# Patient Record
Sex: Female | Born: 1965 | Hispanic: No | Marital: Married | Smoking: Never smoker
Health system: Southern US, Community
[De-identification: ages and names within clinical notes are randomized; demographics above are authoritative.]

## PROBLEM LIST (undated history)

## (undated) DIAGNOSIS — M349 Systemic sclerosis, unspecified: Secondary | ICD-10-CM

## (undated) DIAGNOSIS — Q211 Atrial septal defect: Secondary | ICD-10-CM

## (undated) HISTORY — DX: Atrial septal defect: Q21.1

## (undated) HISTORY — DX: Systemic sclerosis, unspecified: M34.9

---

## 1997-08-23 ENCOUNTER — Emergency Department (HOSPITAL_COMMUNITY): Admission: EM | Admit: 1997-08-23 | Discharge: 1997-08-23 | Payer: Self-pay | Admitting: Emergency Medicine

## 2004-08-05 ENCOUNTER — Emergency Department (HOSPITAL_COMMUNITY): Admission: EM | Admit: 2004-08-05 | Discharge: 2004-08-05 | Payer: Self-pay | Admitting: Family Medicine

## 2008-11-14 ENCOUNTER — Encounter: Payer: Self-pay | Admitting: Pulmonary Disease

## 2008-11-15 ENCOUNTER — Encounter: Payer: Self-pay | Admitting: Pulmonary Disease

## 2008-12-26 ENCOUNTER — Encounter: Payer: Self-pay | Admitting: Pulmonary Disease

## 2009-01-10 ENCOUNTER — Ambulatory Visit (HOSPITAL_COMMUNITY): Admission: RE | Admit: 2009-01-10 | Discharge: 2009-01-10 | Payer: Self-pay | Admitting: Rheumatology

## 2009-02-05 ENCOUNTER — Encounter: Payer: Self-pay | Admitting: Pulmonary Disease

## 2009-02-21 ENCOUNTER — Ambulatory Visit: Payer: Self-pay | Admitting: Pulmonary Disease

## 2009-02-21 DIAGNOSIS — M349 Systemic sclerosis, unspecified: Secondary | ICD-10-CM | POA: Insufficient documentation

## 2009-02-26 ENCOUNTER — Encounter: Payer: Self-pay | Admitting: Pulmonary Disease

## 2009-03-05 ENCOUNTER — Telehealth (INDEPENDENT_AMBULATORY_CARE_PROVIDER_SITE_OTHER): Payer: Self-pay | Admitting: *Deleted

## 2009-03-05 DIAGNOSIS — J8410 Pulmonary fibrosis, unspecified: Secondary | ICD-10-CM | POA: Insufficient documentation

## 2009-03-08 ENCOUNTER — Ambulatory Visit: Payer: Self-pay | Admitting: Pulmonary Disease

## 2009-03-12 ENCOUNTER — Ambulatory Visit: Payer: Self-pay | Admitting: Internal Medicine

## 2009-03-28 ENCOUNTER — Ambulatory Visit: Payer: Self-pay | Admitting: Pulmonary Disease

## 2009-03-28 DIAGNOSIS — I2789 Other specified pulmonary heart diseases: Secondary | ICD-10-CM | POA: Insufficient documentation

## 2009-04-02 ENCOUNTER — Ambulatory Visit (HOSPITAL_COMMUNITY): Admission: RE | Admit: 2009-04-02 | Discharge: 2009-04-02 | Payer: Self-pay | Admitting: Pulmonary Disease

## 2009-04-06 ENCOUNTER — Encounter: Payer: Self-pay | Admitting: Pulmonary Disease

## 2009-04-17 ENCOUNTER — Encounter: Payer: Self-pay | Admitting: Pulmonary Disease

## 2009-04-23 ENCOUNTER — Encounter: Payer: Self-pay | Admitting: Pulmonary Disease

## 2009-05-15 ENCOUNTER — Encounter: Payer: Self-pay | Admitting: Pulmonary Disease

## 2009-06-06 ENCOUNTER — Encounter: Payer: Self-pay | Admitting: Pulmonary Disease

## 2009-10-04 ENCOUNTER — Encounter (INDEPENDENT_AMBULATORY_CARE_PROVIDER_SITE_OTHER): Payer: Self-pay | Admitting: *Deleted

## 2009-10-04 ENCOUNTER — Ambulatory Visit: Payer: Self-pay | Admitting: Pulmonary Disease

## 2009-10-08 ENCOUNTER — Telehealth: Payer: Self-pay | Admitting: Internal Medicine

## 2009-10-15 ENCOUNTER — Ambulatory Visit: Payer: Self-pay | Admitting: Cardiology

## 2009-10-15 ENCOUNTER — Ambulatory Visit: Payer: Self-pay

## 2009-10-15 ENCOUNTER — Ambulatory Visit (HOSPITAL_COMMUNITY): Admission: RE | Admit: 2009-10-15 | Discharge: 2009-10-15 | Payer: Self-pay | Admitting: Pulmonary Disease

## 2009-10-15 ENCOUNTER — Encounter: Payer: Self-pay | Admitting: Pulmonary Disease

## 2009-10-17 ENCOUNTER — Ambulatory Visit: Payer: Self-pay | Admitting: Internal Medicine

## 2009-10-17 ENCOUNTER — Encounter: Payer: Self-pay | Admitting: Internal Medicine

## 2009-10-17 ENCOUNTER — Encounter: Payer: Self-pay | Admitting: Pulmonary Disease

## 2009-10-17 DIAGNOSIS — K224 Dyskinesia of esophagus: Secondary | ICD-10-CM | POA: Insufficient documentation

## 2009-10-17 DIAGNOSIS — K219 Gastro-esophageal reflux disease without esophagitis: Secondary | ICD-10-CM | POA: Insufficient documentation

## 2009-10-22 ENCOUNTER — Encounter: Payer: Self-pay | Admitting: Pulmonary Disease

## 2009-10-24 ENCOUNTER — Telehealth (INDEPENDENT_AMBULATORY_CARE_PROVIDER_SITE_OTHER): Payer: Self-pay | Admitting: *Deleted

## 2009-10-30 ENCOUNTER — Ambulatory Visit: Payer: Self-pay | Admitting: Internal Medicine

## 2009-10-30 DIAGNOSIS — R9389 Abnormal findings on diagnostic imaging of other specified body structures: Secondary | ICD-10-CM | POA: Insufficient documentation

## 2010-01-02 ENCOUNTER — Encounter: Payer: Self-pay | Admitting: Internal Medicine

## 2010-02-04 ENCOUNTER — Telehealth: Payer: Self-pay | Admitting: Internal Medicine

## 2010-02-20 ENCOUNTER — Encounter: Payer: Self-pay | Admitting: Internal Medicine

## 2010-02-25 ENCOUNTER — Telehealth: Payer: Self-pay | Admitting: Internal Medicine

## 2010-02-25 ENCOUNTER — Telehealth: Payer: Self-pay | Admitting: Pulmonary Disease

## 2010-04-16 NOTE — Miscellaneous (Signed)
  Clinical Lists Changes  spoke with Dr. Allyson Sabal after his visit with pt.  Stressed the need to have right heart cath and shunt run given PFO.  He will schedule the pt for the procedure, and let me know when results are available.

## 2010-04-16 NOTE — Letter (Signed)
Summary: [facility] Office Visit Note   [facility] Office Visit Note   Imported By: Roderic Ovens 11/06/2009 15:04:47  _____________________________________________________________________  External Attachment:    Type:   Image     Comment:   External Document

## 2010-04-16 NOTE — Progress Notes (Signed)
Summary: waiting on records  ---- Converted from flag ---- ---- 10/20/2009 9:47 AM, Barbaraann Share MD wrote: Aundra Millet, please see if we can get a copy of recent consult from dr. Azzie Roup.  thanks ------------------------------   LM with Misty Stanley in Medical Records from [facility] to fax records.  Aundra Millet Reynolds LPN  October 24, 2009 2:34 PM   received records and put in [facility] very important look at folder.  Arman Filter LPN  October 25, 2009 9:47 AM

## 2010-04-16 NOTE — Assessment & Plan Note (Signed)
Summary: dysphagia--ch.   History of Present Illness Visit Type: consult  Primary GI MD: Stan Head MD [facility] Primary Provider: Dorothyann Peng, MD  Requesting Provider: Marcelyn Bruins, MD  Chief Complaint: dysphagia  History of Present Illness:   45 yo Asian ([place])woman with 6 months of dysphagia. She says dry food would stick and then even noodles at times. She felt hot in her chest at times also. Dr. Shelle Iron provided Dexilant smaples and she is without any of these problems since starting that. She has scleroderma. Recentky had an evaluation by Dr. Azzie Roup (this AM).   GI Review of Systems    Reports dysphagia with solids.      Denies abdominal pain, acid reflux, belching, bloating, chest pain, dysphagia with liquids, heartburn, loss of appetite, nausea, vomiting, vomiting blood, weight loss, and  weight gain.        Denies anal fissure, black tarry stools, change in bowel habit, constipation, diarrhea, diverticulosis, fecal incontinence, heme positive stool, hemorrhoids, irritable bowel syndrome, jaundice, light color stool, liver problems, rectal bleeding, and  rectal pain. Preventive Screening-Counseling & Management  Alcohol-Tobacco     Alcohol drinks/day: 0     Smoking Status: never     Passive Smoke Exposure: yes  Caffeine-Diet-Exercise     Caffeine use/day: 1     Caffeine Counseling: not indicated; caffeine use is not excessive or problematic     Does Patient Exercise: no  Comments: much less pasive smoke now not sedantary    Barium Swallow  Procedure date:  04/02/2009  Findings:       1.  Moderately dilated esophagus with a patulous GE junction and dysmotility. See report. 2.  No esophageal lesions demonstrated. 3.  No hiatal hernia or reflux.   Current Medications (verified): 1)  Aspirin Low Dose 81 Mg Tabs (Aspirin) .... Take 1 Tablet By Mouth Once A Day 2)  Plaquenil 200 Mg Tabs (Hydroxychloroquine Sulfate) .... Take 1 Tablet By Mouth Two  Times A Day 3)  Lisinopril 2.5 Mg Tabs (Lisinopril) .... Take 1/2 Tab By Mouth Daily 4)  Dexilant 60 Mg Cpdr (Dexlansoprazole) .... One Tablet By Mouth Once Daily  Allergies (verified): No Known Drug Allergies   Past History:  Past Medical History: Reviewed history from 02/21/2009 and no changes required. scleroderma/MCTD  Past Surgical History: Reviewed history from 02/21/2009 and no changes required. None  Family History: None per pt report.  No FH of Colon Cancer:  Social History: Reviewed history from 02/21/2009 and no changes required. Patient never smoked.  pt is married. pt has 3 children. 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   Vital Signs:  Patient profile:   45 year old female Height:      62 inches Weight:      136 pounds BMI:     24.96 BSA:     1.62 Pulse rate:   74 / minute Pulse rhythm:   regular BP sitting:   98 / 60  (left arm) Cuff size:   regular  Vitals Entered By: Ok Anis CMA (October 17, 2009 2:09 PM)  Physical Exam  General:  wd female in nad Eyes:  PERRLA and EOMI.   anicteric Mouth:  clear  Lungs:  minimal faint basilar crackles. Heart:  rrr, no mrg Abdomen:  Soft, nontender  and nondistended. No masses, hepatosplenomegaly or hernias noted. Normal bowel sounds. Extremities:  mild sausage digits  Cervical Nodes:  No significant cervical or supraclavicular adenopathy.  Psych:  Alert and cooperative. Normal mood and affect.   Impression & Recommendations:  Problem # 1:  GERD (ICD-530.81) Assessment New Response to Dexilant indicates this. 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 Continue PPI and change to Nexium 40 mg daily which is on formulary. If this does not work or she has recurrent problems is to call back. Otherwise she should see me  annually.  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 No bird's beak change to suggest achalasia. PPI has relieved dysphagia so will continue. No need for dilation or EGD in this clinical scenarion and with available information (Ba swallow and resolution of sxs on PPI).  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 NEXIUM 2)  Begin taking Nexium after you finish Dexilant samples. 3)  Please schedule a follow-up appointment in 1 year.  Call back sooner if you are having any heartburn or trouble swallowing. 4)  GI Reflux brochure given.  5)  Copy sent to : Dorothyann Peng, MD, Marcelyn Bruins, MD 6)  The medication list was reviewed and reconciled.  All changed / newly prescribed medications were explained.  A complete medication list was provided to the patient / caregiver. Prescriptions: NEXIUM 40 MG CPDR (ESOMEPRAZOLE MAGNESIUM) 1 by mouth once daily 30 minutes before a meal  #30 x 11   Entered and Authorized by:   Iva Boop MD, [facility]   Signed by:   Iva Boop MD, [facility] on 10/17/2009   Method used:   Electronically to        [facility] [phone]* (retail)       [address]       [place], [place]  [address]       Ph: [phone] or [phone]       Fax: [phone]   RxID:   [id number]  also cc: Azzie Roup, MD

## 2010-04-16 NOTE — Letter (Signed)
Summary: [facility]  [facility]   Imported By: Lester  04/03/2009 10:21:29  _____________________________________________________________________  External Attachment:    Type:   Image     Comment:   External Document

## 2010-04-16 NOTE — Letter (Signed)
Summary: [facility]  [facility]   Imported By: Lester [place] 06/12/2009 09:56:52  _____________________________________________________________________  External Attachment:    Type:   Image     Comment:   External Document

## 2010-04-16 NOTE — Progress Notes (Signed)
Summary: Needs Nexium refill  Phone Note Call from Patient Call back at Work Phone [phone]   Call For: Dr Leone Payor Reason for Call: Refill Medication Summary of Call: Needs a refill on her Nexium sent to [facility] needs generic because she cant afford $200. Has been out for 2 wks already Leanor Kail [facility]  February 05, 2010 8:27 AM Initial call taken by:    Follow-up for Phone Call        rx sent to [facility], samples left at front desk.  Pt aware. Follow-up by: Francee Piccolo CMA Duncan Dull),  February 05, 2010 10:40 AM    New/Updated Medications: NEXIUM 40 MG CPDR (ESOMEPRAZOLE MAGNESIUM) 1 by mouth once daily 30 minutes before a meal Prescriptions: NEXIUM 40 MG CPDR (ESOMEPRAZOLE MAGNESIUM) 1 by mouth once daily 30 minutes before a meal  #90 x 3   Entered by:   Francee Piccolo CMA (AAMA)   Authorized by:   Iva Boop MD, [facility]   Signed by:   Francee Piccolo CMA (AAMA) on 02/05/2010   Method used:   Electronically to        [facility]* (retail)             ,          Ph: [phone]       Fax: [phone]   RxID:   [id number]

## 2010-04-16 NOTE — Letter (Signed)
Summary: [facility] Extended Visit   [facility] Extended Visit   Imported By: Roderic Ovens 01/31/2010 15:57:45  _____________________________________________________________________  External Attachment:    Type:   Image     Comment:   External Document

## 2010-04-16 NOTE — Assessment & Plan Note (Signed)
Summary: rov for ISLD, ?pulmonary htn   Primary Provider/Referring Provider:  Dorothyann Peng  CC:  Pt is here for a routine f/u appt.  Pt denied sob or cough.  Pt does c/o " dry food getting stuck in throat."  Pt denied edema.  .  History of Present Illness: The pt comes in today for f/u of her multiple pulmonary issues.  She has autoimmune disease that has been characterized as scleroderma based on serologies and cutaneous biopsies, and also has ?pulmonary htn along with fibrotic changes on ct chest and dilated esophagus.  She had a f/u echo in FEB which showed a PFO, but by report did not show RV changes (there was not an obvious measurement documented of PA pressures).  The pt comes in today for f/u where I now find out she has not had further rheumatology evaluation.  She denies signficant sob or cough, but when questioned carefully, she clearly has some degree of doe.  She is having a lot of reflux symptoms, and c/o of "food getting stuck" on swallowing.  Medications Prior to Update: 1)  Aspirin Low Dose 81 Mg Tabs (Aspirin) .... Take 1 Tablet By Mouth Once A Day 2)  Plaquenil 200 Mg Tabs (Hydroxychloroquine Sulfate) .... Take 1 Tablet By Mouth Two Times A Day  Current Medications (verified): 1)  Aspirin Low Dose 81 Mg Tabs (Aspirin) .... Take 1 Tablet By Mouth Once A Day 2)  Plaquenil 200 Mg Tabs (Hydroxychloroquine Sulfate) .... Take 1 Tablet By Mouth Two Times A Day 3)  Lisinopril 2.5 Mg Tabs (Lisinopril) .... Take 1/2 Tab By Mouth Daily  Allergies (verified): No Known Drug Allergies  Past History:  Past medical, surgical, family and social histories (including risk factors) reviewed, and no changes noted (except as noted below).  Past Medical History: Reviewed history from 02/21/2009 and no changes required. scleroderma/MCTD  Past Surgical History: Reviewed history from 02/21/2009 and no changes required. None  Family History: Reviewed history from 02/21/2009 and no changes  required. None per pt report.   Social History: Reviewed history from 02/21/2009 and no changes required. Patient never smoked.  pt is married. pt has 3 children. 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    Vital Signs:  Patient profile:   45 year old female Height:      62 inches Weight:      135.25 pounds BMI:     24.83 O2 Sat:      98 % on Room air Temp:     98.2 degrees F oral Pulse rate:   71 / minute BP sitting:   94 / 60  (left arm) Cuff size:   regular  Vitals Entered By: Arman Filter LPN (October 04, 2009 9:05 AM)  O2 Flow:  Room air CC: Pt is here for a routine f/u appt.  Pt denied sob or cough.  Pt does c/o " dry food getting stuck in throat."  Pt denied edema.   Comments Medications reviewed with patient Arman Filter LPN  October 04, 2009 9:05 AM    Physical Exam  General:  wd female in nad Nose:  no purulence or drainage noted. Lungs:  minimal faint basilar crackles. Heart:  rrr, no mrg Extremities:  no edema or cyanosis Neurologic:  alert and oriented, moves all 4.   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 ongoing aspiration from her esophageal dysmotolity with symptomatic dysphagia and reflux.  I think she needs to have a GI evaluation, but also more aggressive  treatment of her autoimmune disease.  I am very worried about this patient, and the fact she really doesn't appreciate the gravity of her situation due to the modest language barrier.  Will arrange for GI consult, and also get her referred to another rheumatologist (Dr. Corliss Skains has discharged her from her practice for missing a few visits, but she has been a model pt for me with excellent compliance).    Problem # 2:  PULMONARY HYPERTENSION (ICD-416.8) The pt had an echo in the past with pulmonary htn documented, and had a repeat echo in FEB of this year verified a PFO, but did not show pulmonary htn by the report.  It has been six months, and she obviously needs a f/u study.  She is not asymptomatic, and does have an abnormal dlco on her pfts (although may be due to her pulmonary parenchymal findings).  I have a very low threshold for doing right heart cath.  Will re-evaluate after I see the results of her f/u echo.    Medications Added to Medication List This Visit: 1)  Lisinopril 2.5 Mg Tabs (Lisinopril) .... Take 1/2 tab by mouth daily  Other Orders: Est. Patient Level IV ([address]) Rheumatology Referral (Rheumatology) Gastroenterology Referral (GI) Echo Referral (Echo)  Patient Instructions: 1)  will schedule for echo to evaluate your heart again 2)  will schedule followup with another rheumatologist and with a "stomach" doctor for your swallowing issue. 3)  trial of dexilant 60mg  one each day. 4)  will call you with results of heart study.

## 2010-04-16 NOTE — Consult Note (Signed)
Summary: [facility]   Imported By: Lennie Odor 11/13/2009 16:41:45  _____________________________________________________________________  External Attachment:    Type:   Image     Comment:   External Document

## 2010-04-16 NOTE — Letter (Signed)
Summary: [facility]  [facility]   Imported By: Lester [place] 06/12/2009 09:54:39  _____________________________________________________________________  External Attachment:    Type:   Image     Comment:   External Document

## 2010-04-16 NOTE — Assessment & Plan Note (Signed)
Summary: ov to discuss PFT and CT results/mg   Copy to:  Shaili Deveshwar/ Nanetta Batty Primary Provider/Referring Provider:  None  CC:  Pt is here for a f/u appt to discuss PFT and CT results. .  History of Present Illness: The pt comes in today for f/u of her recent xrays and pfts.  She was found to have by ct chest infiltrates in both bases that were primarily ground glass in nature with interstitial changes as well.  She was also found to have esophageal abnormalities.  Her pfts show mild restriction with mild decrease in dlco.  Her echo report was finally located, and shows mild to moderate pulmonary htn with a patent foramen ovale noted.  I have gone over this as best I can with patient, but there is limited understanding with the language barrier.  Medications Prior to Update: 1)  Aspirin Low Dose 81 Mg Tabs (Aspirin) .... Take 1 Tablet By Mouth Once A Day 2)  Plaquenil 200 Mg Tabs (Hydroxychloroquine Sulfate) .... Take 1 Tablet By Mouth Two Times A Day  Allergies (verified): No Known Drug Allergies  Vital Signs:  Patient profile:   45 year old female Height:      62 inches Weight:      140 pounds O2 Sat:      100 % on Room air Temp:     97.3 degrees F oral Pulse rate:   87 / minute BP sitting:   100 / 60  (left arm) Cuff size:   regular  Vitals Entered By: Arman Filter LPN (March 28, 2009 3:09 PM)  O2 Flow:  Room air CC: Pt is here for a f/u appt to discuss PFT and CT results.  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  This is either inflammatory infiltrates from her known scleroderma/MCTD vs chronic aspiration related to esophageal dysmotility related to her scleroderma.  Her esophagus appears to be abnl on ct, and I think  she needs a barium swallow for evaluation.  She does describe reflux with regurgitation at times.  If this is due to pulmonary inflammation directly related to her underlying autoimmune disease, she will need more aggressive immunosuppressive therapy per rheumatology.  Her pft show definite restriction with dlco abnl, which can be caused by either entity.  She will need a walk at some point in the future.  I have spent with pt today discussing all of the above.  I am not sure she completely understands the seriousness of her condition due to the language barrier.  Problem # 2:  PULMONARY HYPERTENSION (ICD-416.8) the pt had mild to moderate pulmonary htn estimated by echo in july of last year, and the radiologist commented on recent ct chest the PA's appeared overly enlarged.  The echo report also questioned a PFO.  At this point, she will need right heart cath to measure PA pressures and also to do shunt run.  Will arrange for f/u appt with cardiology, and they can also address the question of PFO?  Depending upon results, will need to consider a trial of vasodilator therapy, although clinically she is a class I patient.  Other Orders: Est. Patient Level IV (47829) Cardiology Referral (Cardiology) Radiology Referral (Radiology)  Patient  Instructions: 1)  will schedule for barium swallow to look at your esophagus, and see if your are swallowing food down into your lungs. 2)  will get an apptm for you to see the cardiologist again. 3)  will call you once we have the results of the testing.  Appended Document: ov to discuss PFT and CT results/mg megan, please call Dr Hazle Coca office first thing in am and make sure they got my faxed note.  Pt is seeing him thurs.  Appended Document: ov to discuss PFT and CT results/mg called and spoke with Neysa Bonito from Dr. Hazle Coca office and verified they did receive fax from [facility].

## 2010-04-16 NOTE — Assessment & Plan Note (Signed)
Summary: NP6/CARDIAC WORK UP / PT HAS [employer]. GD   Visit Type:  Follow-up Referring Provider:  Azzie Roup, MD Primary Provider:  Dorothyann Peng, MD   CC:  6 minute walk test.  History of Present Illness: 45 y/o [place] woman with h/o of connective tissue d/o (felt to be lupus) complicated by esophageal dysmotility and intersitial lung disease with decreased DLCO on PTDs. Referred by Dr. Dareen Piano for consideration of right heart catheterization.  Had echo in 2010 with [facility] which showed a PFO and mildly elevated R sided pressures. Had f/u echo 10/15/09 which showed LVEF 55-60% with normal RV. There was mild TR and PAP estimated at 30-96mm HG.   Denies any dyspnea. 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 [place]. Denies any dizziness or presyncope. No palpitations and CP. + food gets stuck and reflux. Saw Dr. Leone Payor and started on PPI and symptoms resolved. +Raynaud's  Medications Prior to Update: 1)  Aspirin Low Dose 81 Mg Tabs (Aspirin) .... Take 1 Tablet By Mouth Once A Day 2)  Plaquenil 200 Mg Tabs (Hydroxychloroquine Sulfate) .... Take 1 Tablet By Mouth Two Times A Day 3)  Lisinopril 2.5 Mg Tabs (Lisinopril) .... Take 1/2 Tab By Mouth Daily 4)  Nexium 40 Mg Cpdr (Esomeprazole Magnesium) .Marland Kitchen.. 1 By Mouth Once Daily 30 Minutes Before A Meal  Current Medications (verified): 1)  Aspirin Low Dose 81 Mg Tabs (Aspirin) .... Take 1 Tablet By Mouth Once A Day 2)  Plaquenil 200 Mg Tabs (Hydroxychloroquine Sulfate) .... Take 1 Tablet By Mouth Two Times A Day 3)  Lisinopril 2.5 Mg Tabs (Lisinopril) .... Take 1/2 Tab By Mouth Daily 4)  Nexium 40 Mg Cpdr (Esomeprazole Magnesium) .Marland Kitchen.. 1 By Mouth Once Daily 30 Minutes Before A Meal  Allergies (verified): No Known Drug Allergies  Past History:  Past Medical History: Last updated: 02/21/2009 scleroderma/MCTD  Past Surgical History: Last updated: 02/21/2009 None  Family History: Last  updated: 10/17/2009 None per pt report.  No FH of Colon Cancer:  Social History: Last updated: 02/21/2009 Patient never smoked.  pt is married. pt has 3 children. 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 None per pt report.  No FH of Colon Cancer:  Social History: Reviewed history from 02/21/2009 and no changes required. Patient never smoked.  pt is married. pt has 3 children. 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   Vital Signs:  Patient profile:   45 year old female Height:      62 inches Weight:      136 pounds Pulse rate:   69 / minute BP sitting:   90 / 56  (left arm)  Vitals Entered By: Laurance Flatten CMA (October 30, 2009 8:48 AM) CC: 6 minute walk test Pain Assessment Patient in pain? no       Does patient need assistance? Ambulation Normal Comments Pt was walked in back hall with O2 sat monitor in place.  O2 sats ranged from 99% to 97% at the lowest with a HR of 73 to begin and 111 at end of test. She walked 1000 feet without any complaints of SOB/fatigue or  palpitations   Physical Exam  General:  Thin well appearing. no resp difficulty HEENT: normal Neck: supple. no JVD. Carotids 2+ bilat; no bruits. No lymphadenopathy or thryomegaly appreciated. Cor: PMI nondisplaced. No RV lift. Regular rate & rhythm. No rubs, gallops, murmur. Lungs: clear Abdomen: soft, nontender, nondistended. No hepatosplenomegaly. No bruits or masses. Good bowel sounds. Extremities: no cyanosis, rash, edema. impressive clubbing Neuro: alert & orientedx3, cranial nerves grossly intact. moves all 4 extremities w/o difficulty.  affect pleasant    Impression & Recommendations:  Problem # 1:  ABNORMAL ECHOCARDIOGRAM (ICD-793.2) Based on her echo she does not have significant pulmonary HTN despite the fact that she is at high risk. Also no evidence of PAH/right heart strain on exam or ECG. Appears to be NYHA Class I without functional limitation. 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 We will do 6 MW with pulse oximetry. If truly NYHA Class I with no exertional desaturations we will defer right heart cath for now and f/u with echo in 6 months to reassess. If compromised or exertional desats will need RHC now.   walked over 1000 feet with no limitations (could have gone farther) sats 98% on RA. Will defer RHC for now. See back in 6 months unless developing symptoms in which case we will see sooner.   Orders: EKG w/ Interpretation (93000)  Patient Instructions: 1)  Your physician recommends that you schedule a follow-up appointment in: 6 months with a 6 minute walk test and a 2 d Echo 2)  Your physician recommends that you continue on your current medications as directed. Please refer to the Current Medication list given to you today.

## 2010-04-16 NOTE — Progress Notes (Signed)
Summary: Appt sooner than first available  Phone Note From Other Clinic   Caller: Almyra Free [email or web address] @n  Dr Shelle Iron Call For: Dr Leone Payor Reason for Call: Schedule Patient Appt Summary of Call: Requesting patiebnt be seen sooner than 12-03-09, within 2wks for dysphagia. Initial call taken by: Leanor Kail [facility],  October 08, 2009 11:43 AM  Follow-up for Phone Call        Left message for patient to call back Darcey Nora RN, [facility]  October 08, 2009 12:17   patient rescheduled for 10/17/09 2:15 with Almyra Free. Follow-up by: Darcey Nora RN, CGRN,  October 08, 2009 2:13 PM

## 2010-04-16 NOTE — Letter (Signed)
Summary: New Patient letter  [facility]  [address] [place], [place] [address]   Phone: [phone]  Fax: [phone]       10/04/2009 MRN: [id number]  Stacey Williamson [address] [place], [place]  [address]  Dear Ms. Stacey Williamson,  Welcome to the Gastroenterology Division at [facility].    You are scheduled to see Dr.  Leone Payor on 12-03-09 at 8:45a.m. on the 3rd floor at [facility], [address].  We ask that you try to arrive at our office 15 minutes prior to your appointment time to allow for check-in.  We would like you to complete the enclosed self-administered evaluation form prior to your visit and bring it with you on the day of your appointment.  We will review it with you.  Also, please bring a complete list of all your medications or, if you prefer, bring the medication bottles and we will list them.  Please bring your insurance card so that we may make a copy of it.  If your insurance requires a referral to see a specialist, please bring your referral form from your primary care physician.  Co-payments are due at the time of your visit and may be paid by cash, check or credit card.     Your office visit will consist of a consult with your physician (includes a physical exam), any laboratory testing he/she may order, scheduling of any necessary diagnostic testing (e.g. x-ray, ultrasound, CT-scan), and scheduling of a procedure (e.g. Endoscopy, Colonoscopy) if required.  Please allow enough time on your schedule to allow for any/all of these possibilities.    If you cannot keep your appointment, please call [phone] to cancel or reschedule prior to your appointment date.  This allows [place] the opportunity to schedule an appointment for another patient in need of care.  If you do not cancel or reschedule by 5 p.m. the business day prior to your appointment date, you will be charged a $50.00 late cancellation/no-show fee.    Thank you for choosing  [facility] for your medical needs.  We appreciate the opportunity to care for you.  Please visit [place] at our website  to learn more about our practice.                     Sincerely,                                                             The Gastroenterology Division

## 2010-04-16 NOTE — Miscellaneous (Signed)
Summary: Orders Update  Clinical Lists Changes  Orders: Added new Referral order of Echo Referral (Echo) - Signed 

## 2010-04-16 NOTE — Miscellaneous (Signed)
Summary: f/u echo with no pulmonary htn  Clinical Lists Changes  the pt has a h/o pulm htn by prior echo, and I had asked Dr.Berry from cardiology to do right heart cath to document pressures given her history of scleroderma.  Instead, he repeated the echo on 04/23/09.  This showed a definite PFO, nl RV pressures..she was felt to not need a right heart cath I do believe she needs very close echo f/u given her history. will arrange f/u for pt with me for her isld.  Appended Document: f/u echo with no pulmonary htn megan, let pt know that I need to see her in July for f/u of her lung issues.  Appended Document: f/u echo with no pulmonary htn megan see above.  She also needs an apptm with Gi because of her reflux/regurgitation that is related to her scleroderma.Marland KitchenMarland KitchenMarland KitchenI can send an order to [facility] once you discuss it with her.  I have tried to get in touch with her multiple times.  left message to call us.  if we can't get ahold of her, just get ov with me.  Appended Document: f/u echo with no pulmonary htn called and spoke with pt.  pt scheduled to see [facility] 09-26-2009 at 9am.  also informed pt of referral to GI.  Pt refused stating her reflux is "all gone."  I informed her the importance of the GI referral but pt still refused.    Appended Document: f/u echo with no pulmonary htn noted.

## 2010-04-18 NOTE — Progress Notes (Signed)
Summary: Medication  Phone Note Call from Patient Call back at Home Phone [phone]   Caller: Patient Call For: Dr. Leone Payor Reason for Call: Refill Medication Summary of Call: Needs refill on her Nexium.[facility] Initial call taken by: Karna Christmas,  February 25, 2010 12:22 PM  Follow-up for Phone Call        Rx re-sent to [facility].  Message left on home number that rx has been resent.  To call if she has any further problems. Follow-up by: Francee Piccolo CMA Duncan Dull),  February 25, 2010 2:08 PM    New/Updated Medications: NEXIUM 40 MG CPDR (ESOMEPRAZOLE MAGNESIUM) 1 by mouth once daily 30 minutes before a meal Prescriptions: NEXIUM 40 MG CPDR (ESOMEPRAZOLE MAGNESIUM) 1 by mouth once daily 30 minutes before a meal  #90 x 3   Entered by:   Francee Piccolo CMA (AAMA)   Authorized by:   Iva Boop MD, [facility]   Signed by:   Francee Piccolo CMA (AAMA) on 02/25/2010   Method used:   Electronically to        [facility]* (retail)             ,          Ph: [phone]       Fax: [phone]   RxID:   [id number]

## 2010-04-18 NOTE — Letter (Signed)
Summary: [facility] Extended Visit Note   [facility] Extended Visit Note   Imported By: Roderic Ovens 03/14/2010 10:01:09  _____________________________________________________________________  External Attachment:    Type:   Image     Comment:   External Document

## 2010-04-18 NOTE — Progress Notes (Signed)
Summary: nexium rx  Phone Note Call from Patient   Caller: Patient Call For: clance Summary of Call: pt wants rx for nexium called in to [facility]. says she had requested this previously but this had not been done. (i asked her if kc was the prescribing dr and she says yes). pt # [id number] Initial call taken by: Tivis Ringer, CNA,  February 25, 2010 10:17 AM  Follow-up for Phone Call        spoke with pt and advised rx for nexium was sent by Dr. Teresita Madura office. She states there is some problem withthe rx. I advised her to speak to dr Teresita Madura office about this because that is who prescribed the medication. I transferred pt to [facility]. Carron Curie CMA  February 25, 2010 12:21 PM

## 2010-04-22 ENCOUNTER — Ambulatory Visit (HOSPITAL_COMMUNITY): Payer: BC Managed Care – PPO | Attending: Cardiology

## 2010-04-22 DIAGNOSIS — I079 Rheumatic tricuspid valve disease, unspecified: Secondary | ICD-10-CM | POA: Insufficient documentation

## 2010-04-22 DIAGNOSIS — I2789 Other specified pulmonary heart diseases: Secondary | ICD-10-CM

## 2010-04-29 ENCOUNTER — Encounter: Payer: Self-pay | Admitting: Pulmonary Disease

## 2010-04-29 ENCOUNTER — Ambulatory Visit (INDEPENDENT_AMBULATORY_CARE_PROVIDER_SITE_OTHER): Payer: BC Managed Care – PPO | Admitting: Pulmonary Disease

## 2010-04-29 DIAGNOSIS — J8410 Pulmonary fibrosis, unspecified: Secondary | ICD-10-CM

## 2010-05-14 NOTE — Assessment & Plan Note (Signed)
Summary: rov for ISLD   Copy to:  Azzie Roup, MD Primary Provider/Referring Provider:  Dorothyann Peng, MD   CC:  F/u appt.  Pt had echo done 04-22-2010.  Pt states occ. non-productive cough.  denies increased sob, sore throat, and or fever. 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  She has had a recent echo which shows no significant pulmonary htn, and she feels her swallowing has been ok as long as she stays on PPI.  Her breathing is at baseline, and she denies any worsening of her exertional tolerance.  She has mild nonproductive cough that she feels is not overly significant.  Medications Prior to Update: 1)  Aspirin Low Dose 81 Mg Tabs (Aspirin) .... Take 1 Tablet By Mouth Once A Day 2)  Plaquenil 200 Mg Tabs (Hydroxychloroquine Sulfate) .... Take 1 Tablet By Mouth Two Times A Day 3)  Lisinopril 2.5 Mg Tabs (Lisinopril) .... Take 1/2 Tab By Mouth Daily 4)  Nexium 40 Mg Cpdr (Esomeprazole Magnesium) .Marland Kitchen.. 1 By Mouth Once Daily 30 Minutes Before A Meal  Current Medications (verified): 1)  Aspirin Low Dose 81 Mg Tabs (Aspirin) .... Take 1 Tablet By Mouth Once A Day 2)  Plaquenil 200 Mg Tabs (Hydroxychloroquine Sulfate) .... Take 1 Tablet By Mouth Two Times A Day 3)  Nexium 40 Mg Cpdr (Esomeprazole Magnesium) .Marland Kitchen.. 1 By Mouth Once Daily 30 Minutes Before A Meal  Allergies (verified): No Known Drug Allergies  Past History:  Past medical, surgical, family and social histories (including risk factors) reviewed, and no changes noted (except as noted below).  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 None  Family History: Reviewed history from  10/17/2009 and no changes required. None per pt report.  No FH of Colon Cancer:  Social History: Reviewed history from 02/21/2009 and no changes required. Patient never smoked.  pt is married. pt has 3 children. 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  The patient denies shortness of breath with activity, shortness of breath at rest, productive cough, coughing up blood, chest pain, irregular heartbeats, acid heartburn, indigestion, loss of appetite, weight change, abdominal pain, difficulty swallowing, sore throat, tooth/dental problems, headaches, nasal congestion/difficulty breathing through nose, sneezing, itching, ear ache, anxiety, depression, hand/feet swelling, joint stiffness or pain, rash, change in color of mucus, and fever.    Vital Signs:  Patient profile:   45 year old female Height:      62 inches Weight:      142.25 pounds BMI:     26.11 O2 Sat:      99 % on Room air Temp:     98.1 degrees F oral Pulse rate:   76 / minute BP sitting:   102 / 56  (left arm) Cuff size:   regular  Vitals Entered By: Arman Filter LPN (April 29, 2010 9:12 AM)  O2 Flow:  Room air CC: F/u appt.  Pt had echo done 04-22-2010.  Pt states occ. non-productive cough.  denies increased sob, sore throat, or fever.  Comments Medications reviewed with patient Arman Filter LPN  April 29, 2010 9:12 AM    Physical  Exam  General:  wd female in nad Lungs:  mild basilar crackles, no wheezing Heart:  rrr Extremities:  no edema or cyanosis  Neurologic:  alert, oriented, moves all 4.   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  Currently, she is maintaining a stable baseline wrt her exertional tolerance, and denies any ongoing cough.  Will see her back in 6mos, and will check cxr and full pfts to compare  to last year.  She is to call me if pulmonary symptoms develop in the interim.  Problem # 2:  PULMONARY HYPERTENSION (ICD-416.8) the pt's most recent echo shows no significant pulm. htn...this is good news, but probably needs to be followed yearly .  Other Orders: Est. Patient Level III (11914)  Patient Instructions: 1)  your heart study was ok. 2)  will see you back in 6mos, and will check lung xray and also do breathing studies.   Appended Document: rov for ISLD megan, please check and make sure this pt got an appt for pfts with her next visit.  Appended Document: rov for ISLD yes.  pt is scheduled for pft at 9 am on 11-11-2010 and is scheduled to see you same day at 10 am.

## 2010-11-08 ENCOUNTER — Encounter: Payer: Self-pay | Admitting: Pulmonary Disease

## 2010-11-11 ENCOUNTER — Ambulatory Visit (INDEPENDENT_AMBULATORY_CARE_PROVIDER_SITE_OTHER): Payer: BC Managed Care – PPO | Admitting: Pulmonary Disease

## 2010-11-11 ENCOUNTER — Other Ambulatory Visit: Payer: Self-pay | Admitting: Pulmonary Disease

## 2010-11-11 ENCOUNTER — Encounter: Payer: Self-pay | Admitting: Pulmonary Disease

## 2010-11-11 ENCOUNTER — Ambulatory Visit (INDEPENDENT_AMBULATORY_CARE_PROVIDER_SITE_OTHER)
Admission: RE | Admit: 2010-11-11 | Discharge: 2010-11-11 | Disposition: A | Discharge status: Home / Self Care | Payer: BC Managed Care – PPO | Source: Ambulatory Visit | Attending: Internal Medicine | Admitting: Internal Medicine

## 2010-11-11 VITALS — BP 90/60 | HR 74 | Temp 98.1°F | Ht 62.0 in | Wt 121.0 lb

## 2010-11-11 DIAGNOSIS — I272 Pulmonary hypertension, unspecified: Secondary | ICD-10-CM

## 2010-11-11 DIAGNOSIS — J8410 Pulmonary fibrosis, unspecified: Secondary | ICD-10-CM

## 2010-11-11 DIAGNOSIS — I2789 Other specified pulmonary heart diseases: Secondary | ICD-10-CM

## 2010-11-11 LAB — PULMONARY FUNCTION TEST

## 2010-11-11 NOTE — Progress Notes (Signed)
PFT done today. 

## 2010-11-11 NOTE — Assessment & Plan Note (Signed)
The patient has known interstitial disease that is either due to reflux with microaspiration versus her autoimmune disease.  She is doing much better clinically since eating better and losing weight, and her x-ray today is stable.  Her  PFTs are improved from the last check.  I have asked her to followup with me in one year, and will recheck her x-ray and breathing studies.

## 2010-11-11 NOTE — Progress Notes (Signed)
  Subjective:    Patient ID: Stacey Williamson, female    DOB: May 22, 1965, 45 y.o.   MRN: [id number]  HPI The patient comes in today for followup of her known interstitial disease.  It has been unclear whether this is due to her autoimmune disease, or whether this is related to occult aspiration associated with her esophageal issues.  Since the last visit, the patient has been eating much better, and has lost weight through a vigorous exercise program.  Since doing this, her reflux has essentially resolved.  She did have pulmonary function studies today which actually showed an improvement from her study in the end of 2010.  Her chest x-ray today is also totally stable.  The patient denies shortness of breath, and has had no significant cough.  She has a followup scheduled with cardiology to keep an eye on possible pulmonary hypertension.   Review of Systems  Constitutional: Positive for unexpected weight change. Negative for fever.  HENT: Positive for trouble swallowing. Negative for ear pain, nosebleeds, congestion, sore throat, rhinorrhea, sneezing, dental problem, postnasal drip and sinus pressure.   Eyes: Negative for redness and itching.  Respiratory: Negative for cough, chest tightness, shortness of breath and wheezing.   Cardiovascular: Negative for palpitations and leg swelling.  Gastrointestinal: Negative for nausea and vomiting.  Genitourinary: Negative for dysuria.  Musculoskeletal: Negative for joint swelling.  Skin: Negative for rash.  Neurological: Negative for headaches.  Hematological: Does not bruise/bleed easily.  Psychiatric/Behavioral: Negative for dysphoric mood. The patient is not nervous/anxious.        Objective:   Physical Exam Well-developed female in no acute distress Nose without purulence or discharge noted Chest surprisingly clear, and no obvious crackles heard Cardiac exam with regular rate and rhythm Lower extremities without edema, no cyanosis noted Alert and  oriented, moves all 4 extremities.       Assessment & Plan:

## 2010-11-11 NOTE — Patient Instructions (Signed)
Continue with your exercise program and improved eating habits. Your chest x-ray is stable today and your breathing tests are actually improved. Keep followup with your primary care physician, and also with your cardiologist. Followup with me in one year, and we will check chest x-ray and breathing studies the same day.  Please call sooner if having increased shortness of breath or cough.

## 2010-11-11 NOTE — Assessment & Plan Note (Signed)
Followed by cardiology with serial echo scheduled.

## 2010-11-21 ENCOUNTER — Encounter: Payer: Self-pay | Admitting: Pulmonary Disease

## 2011-01-17 IMAGING — CR DG CHEST 2V
2 series · 2 of 2 positions shown · non-contrast
Comparison: None

CLINICAL DATA: Rule out TB

CHEST - 2 VIEW

[w chest pa]
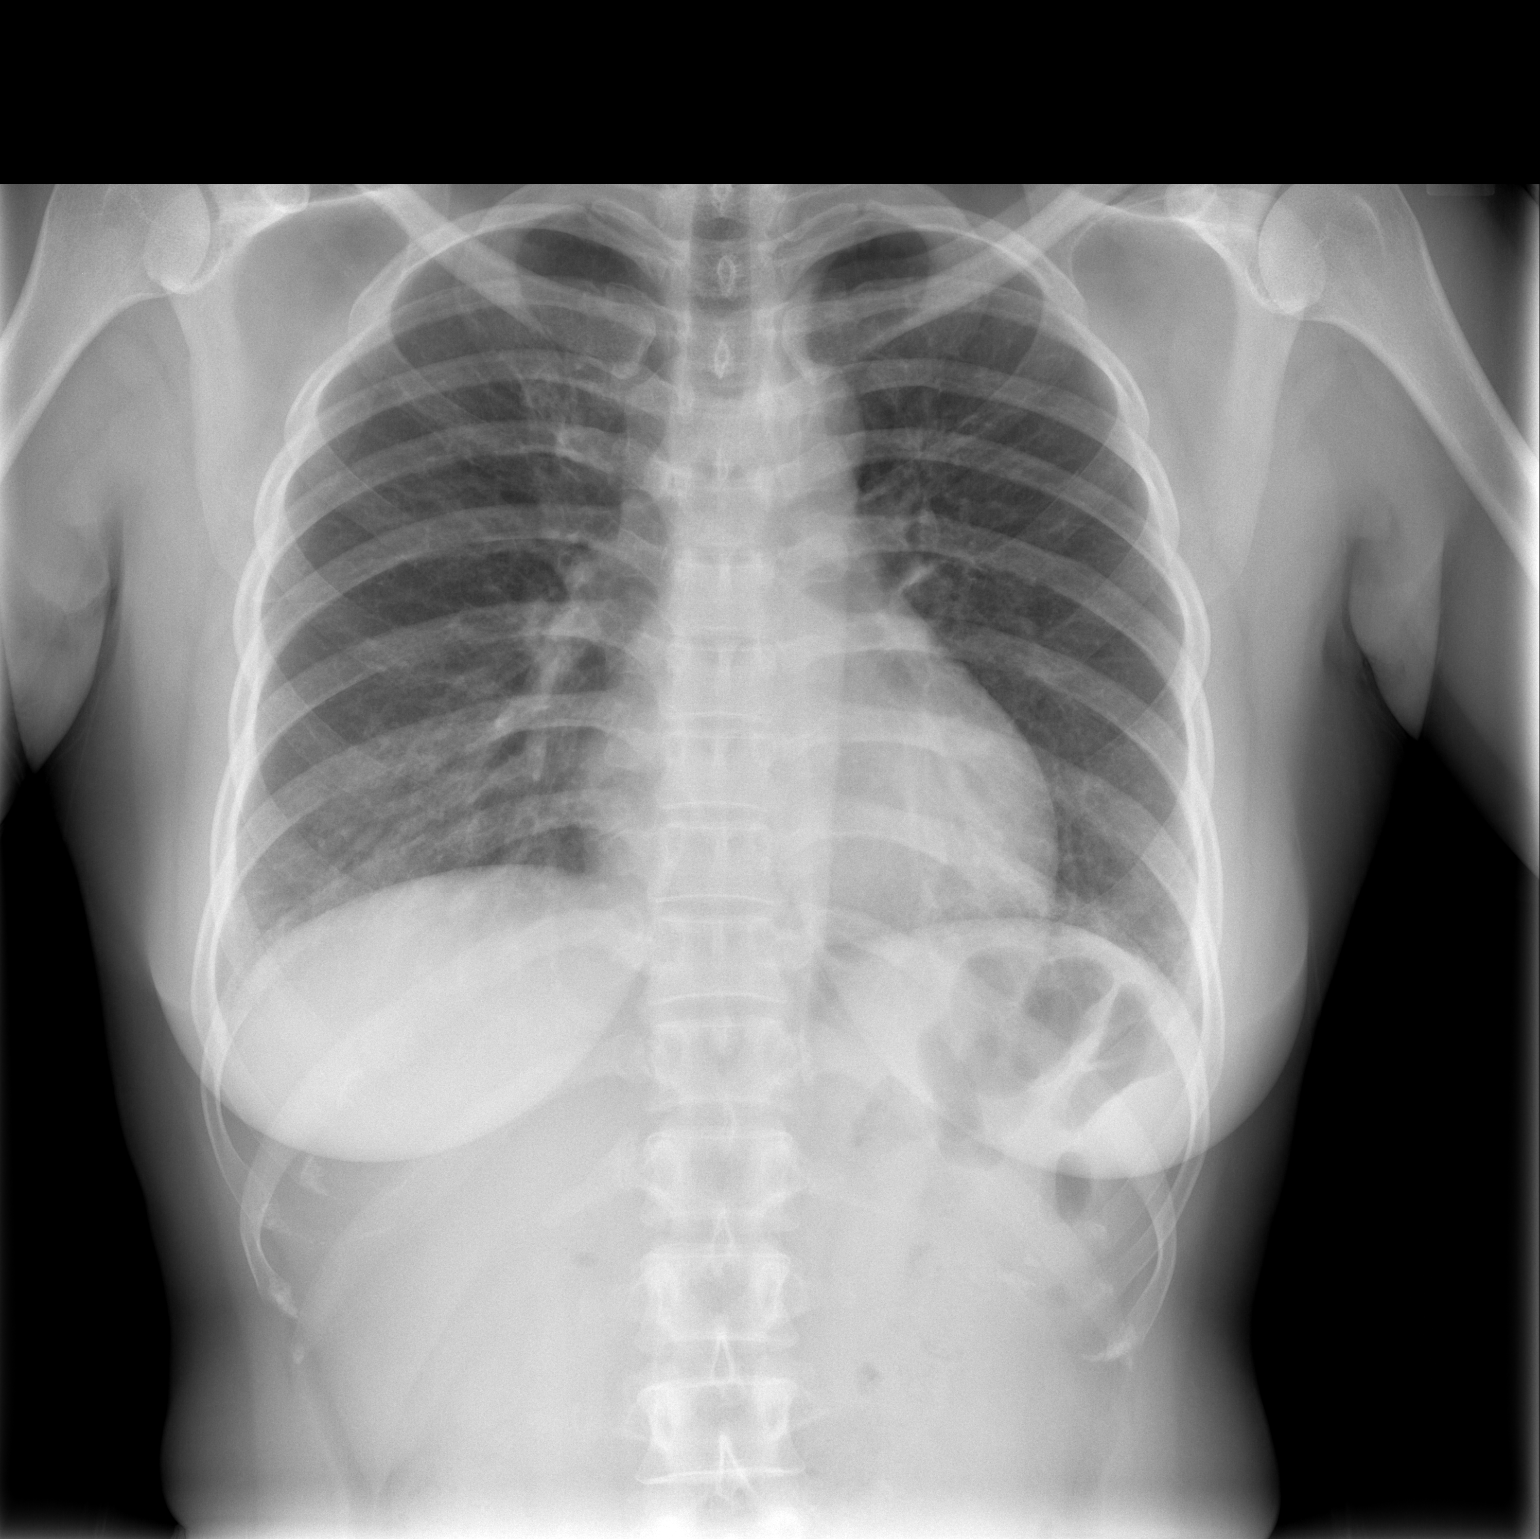

[w chest lat]
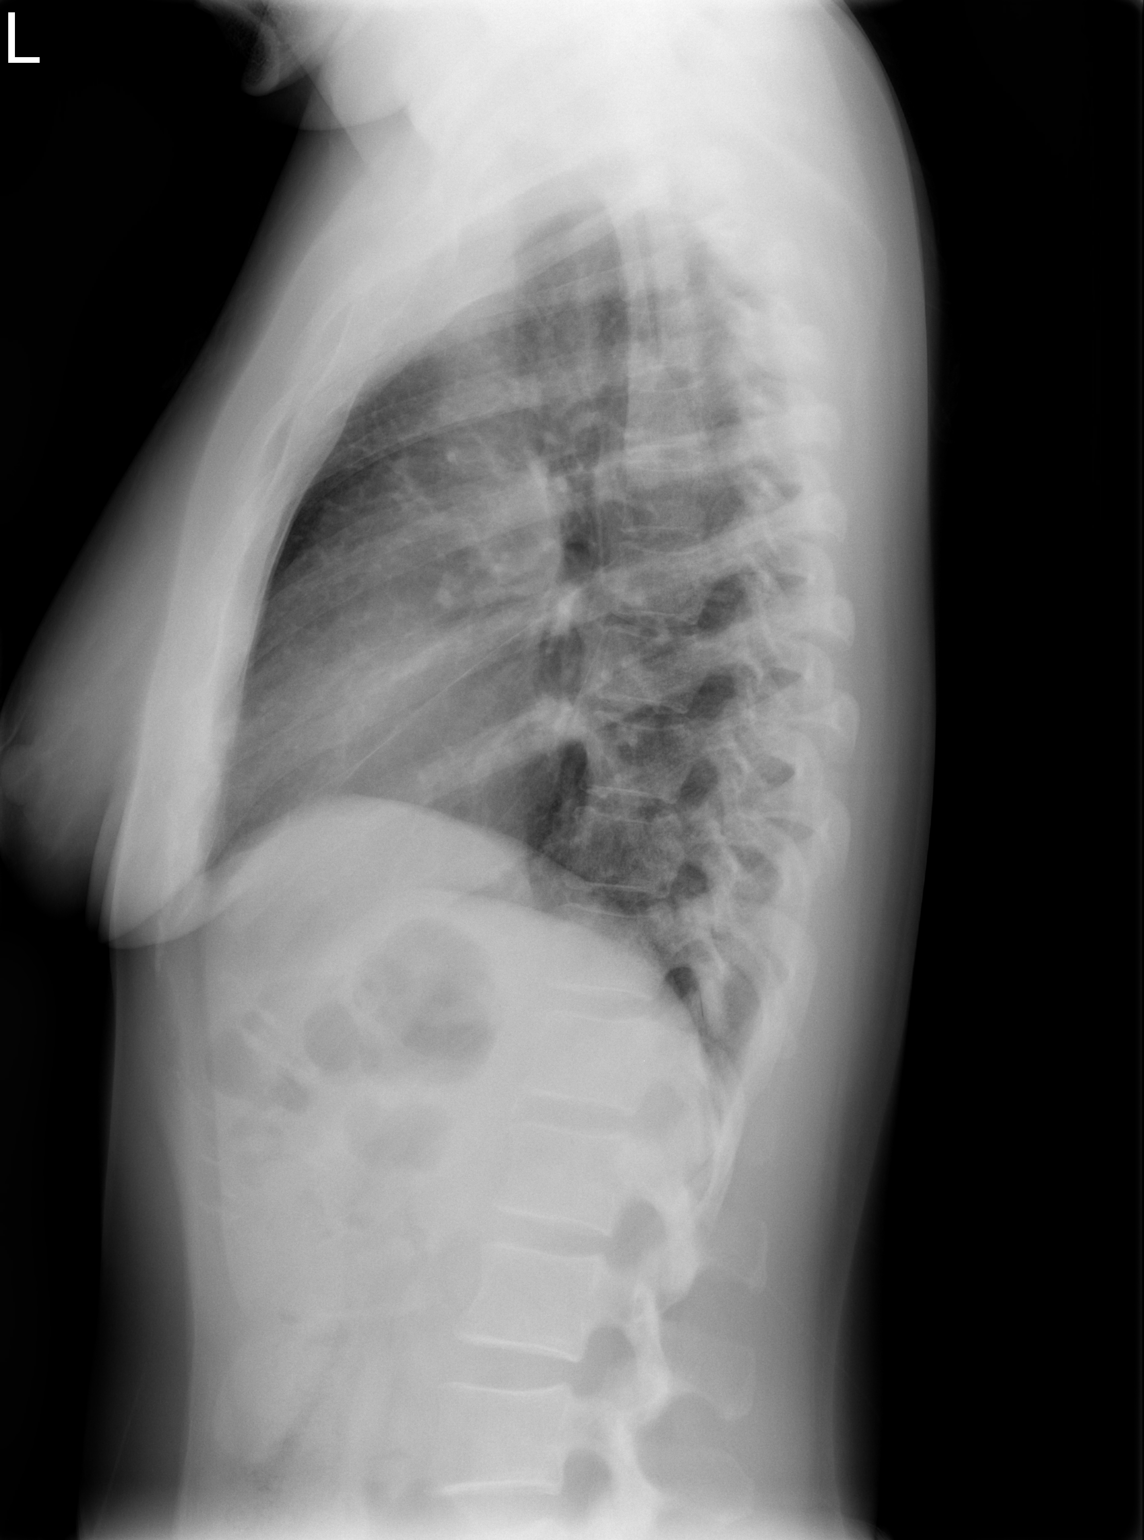

[2 of 2 positions shown; findings below may reference images not displayed]

FINDINGS: Cardiomediastinal silhouette is unremarkable.  No acute
infiltrate or edema.  No adenopathy suggested.  Bony thorax is
unremarkable.  Bilateral basilar atelectasis.
IMPRESSION: No active disease.  No adenopathy suggested.

## 2011-02-28 IMAGING — CR DG CHEST 2V
2 series · 2 of 2 positions shown · non-contrast
Comparison: 01/10/2009

CLINICAL DATA: Scleroderma, lupus.

CHEST - 2 VIEW

[view not recorded (1 of 2)]
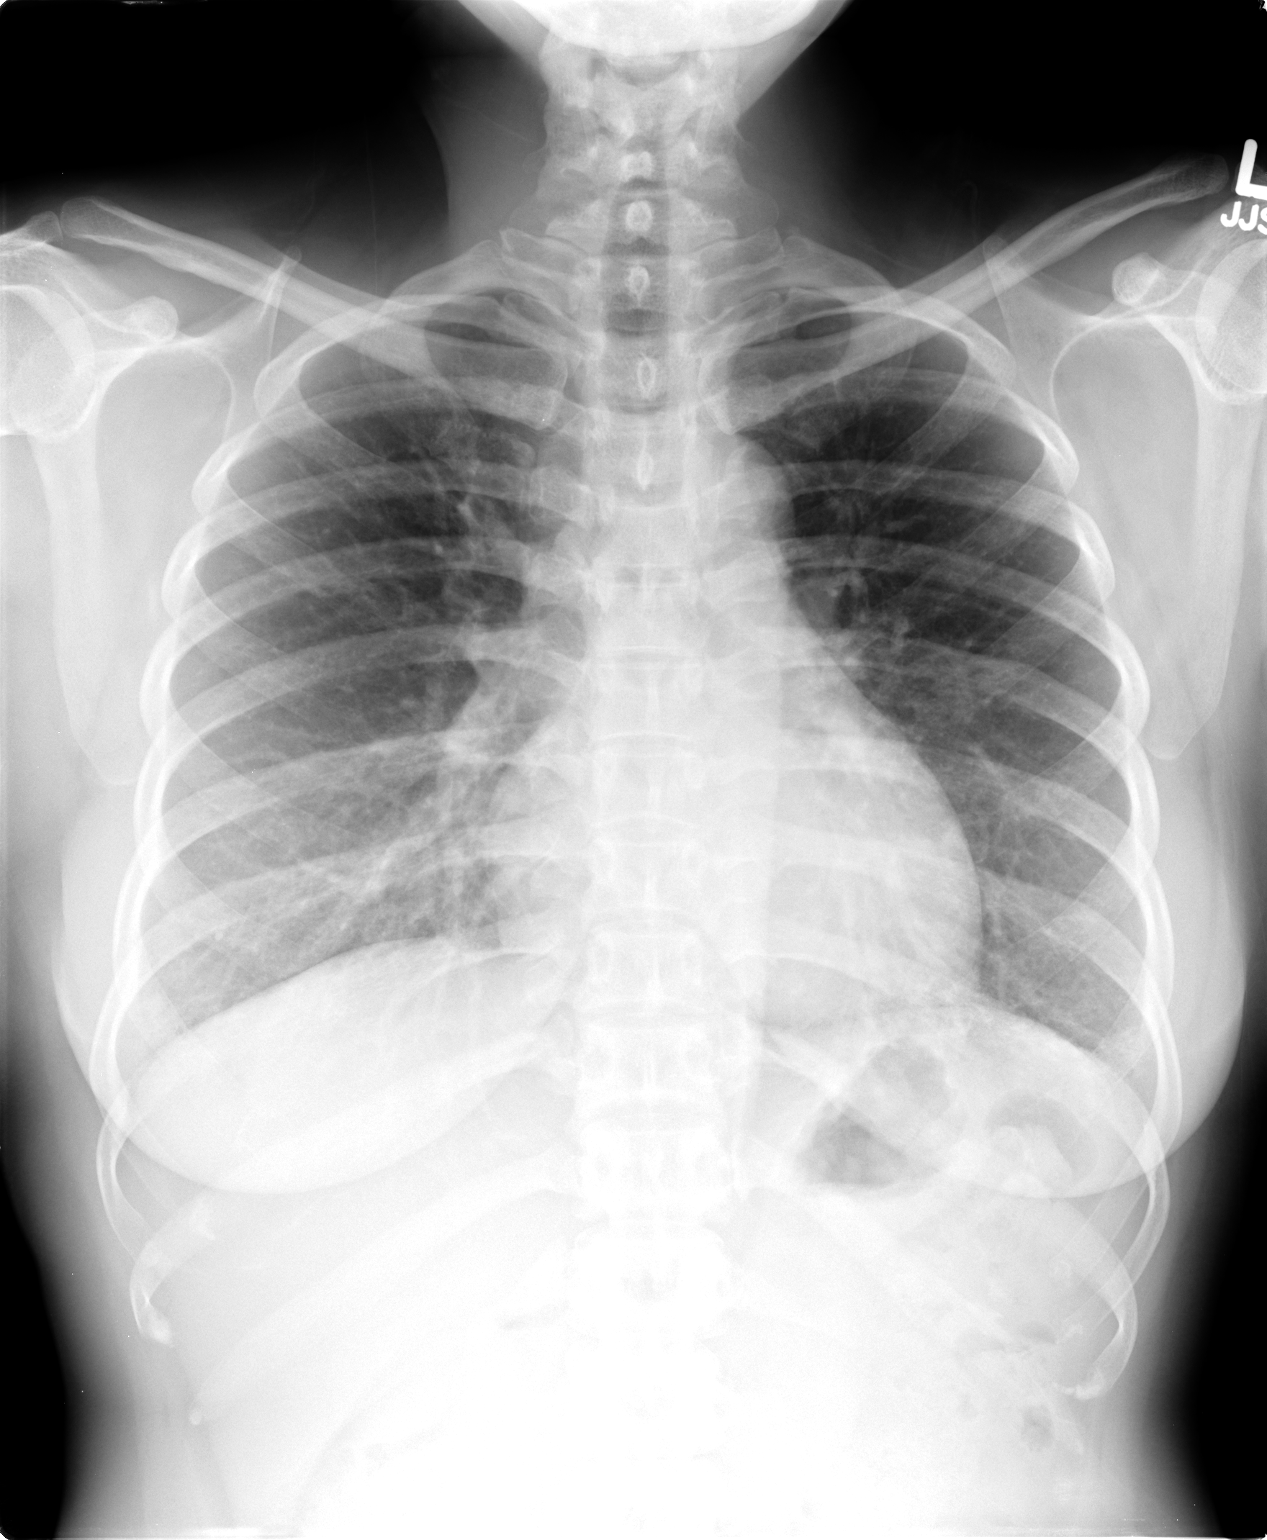

[view not recorded (2 of 2)]
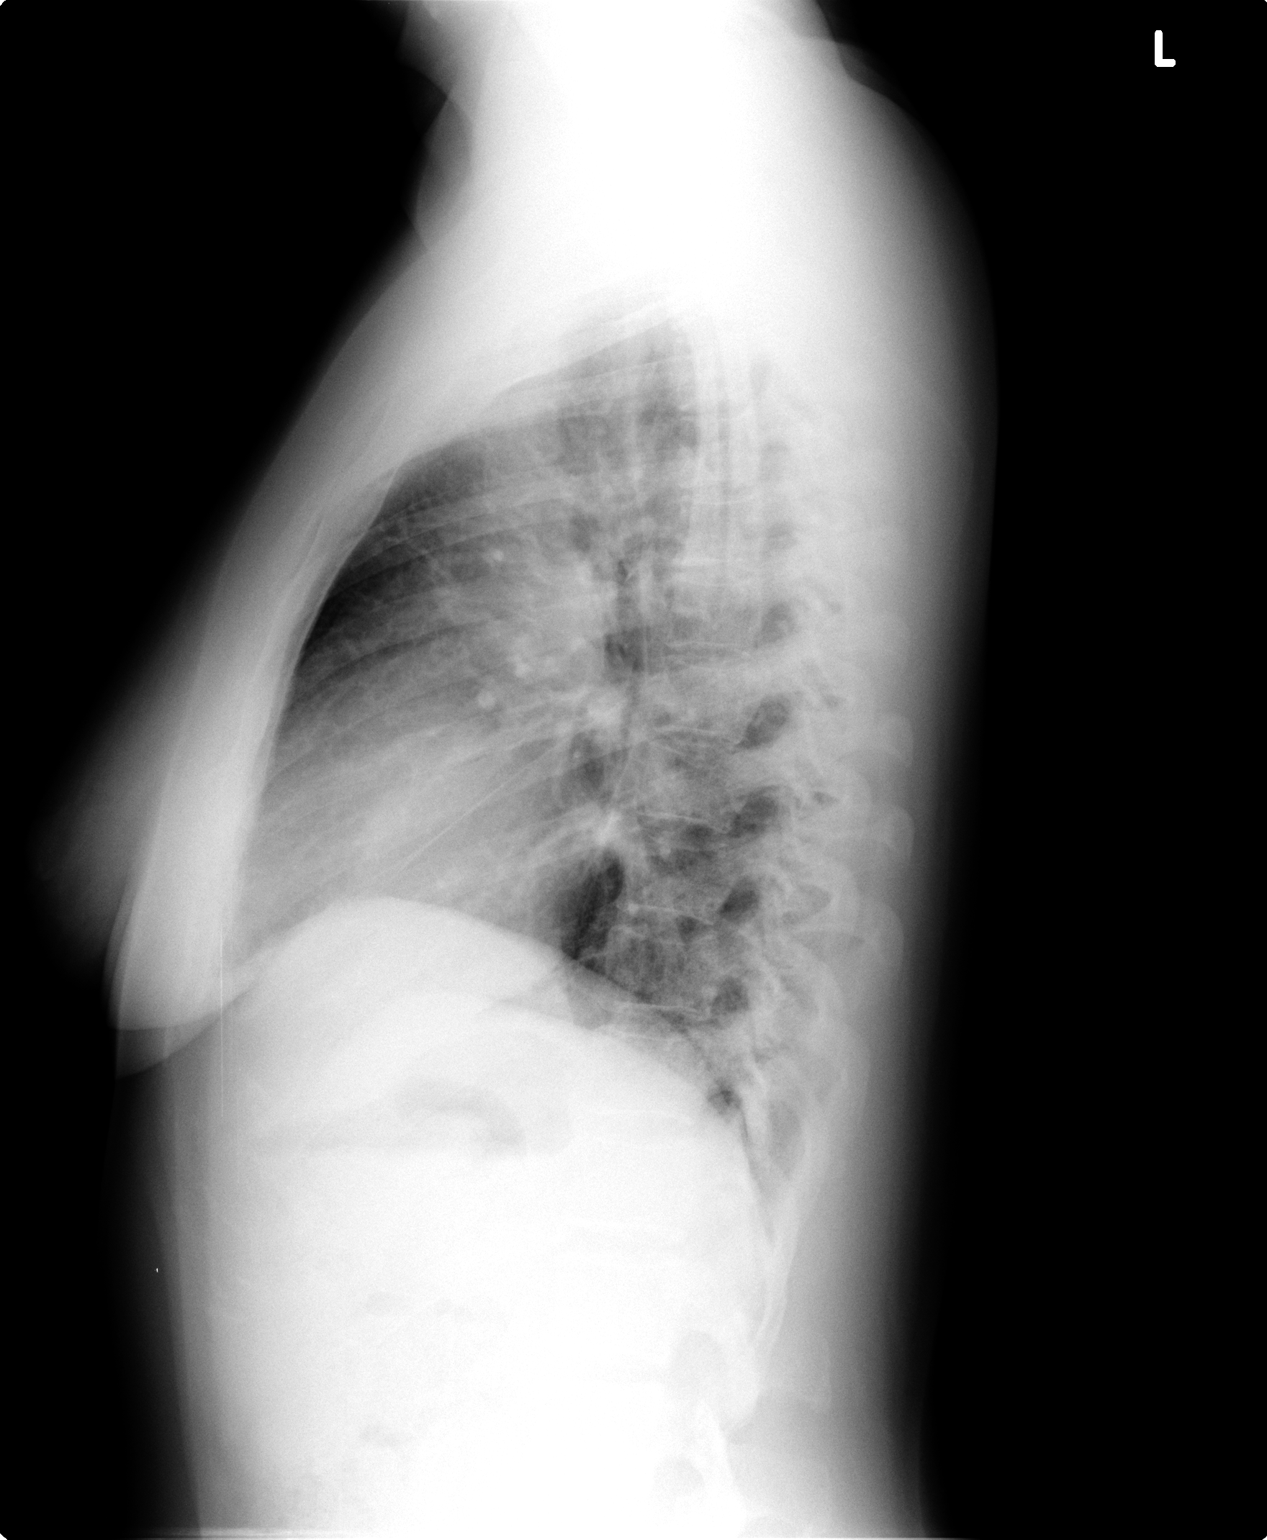

[2 of 2 positions shown; findings below may reference images not displayed]

FINDINGS: Trachea is midline.  Heart size normal.  There is subtle
parenchymal coarsening at the lung bases bilaterally, as before.
No air space consolidation.  No pleural fluid.
IMPRESSION: Mild bibasilar fibrotic changes, likely representing collagen
vascular disease-related interstitial lung disease.  If further
evaluation is desired, high resolution chest CT would be helpful.

## 2011-11-11 ENCOUNTER — Ambulatory Visit: Payer: BC Managed Care – PPO | Admitting: Pulmonary Disease

## 2016-05-16 ENCOUNTER — Other Ambulatory Visit: Payer: Self-pay | Admitting: Internal Medicine

## 2016-05-16 DIAGNOSIS — Z1231 Encounter for screening mammogram for malignant neoplasm of breast: Secondary | ICD-10-CM

## 2016-05-29 ENCOUNTER — Ambulatory Visit
Admission: RE | Admit: 2016-05-29 | Discharge: 2016-05-29 | Disposition: A | Discharge status: Home / Self Care | Payer: BLUE CROSS/BLUE SHIELD | Source: Ambulatory Visit | Attending: Internal Medicine | Admitting: Internal Medicine

## 2016-05-29 DIAGNOSIS — Z1231 Encounter for screening mammogram for malignant neoplasm of breast: Secondary | ICD-10-CM

## 2018-01-20 ENCOUNTER — Encounter: Payer: Self-pay | Admitting: Nurse Practitioner

## 2018-01-20 ENCOUNTER — Ambulatory Visit: Payer: BLUE CROSS/BLUE SHIELD | Admitting: Nurse Practitioner

## 2018-01-20 VITALS — BP 102/64 | HR 65 | Temp 97.8°F | Ht 60.5 in | Wt 129.2 lb

## 2018-01-20 DIAGNOSIS — L309 Dermatitis, unspecified: Secondary | ICD-10-CM

## 2018-01-20 DIAGNOSIS — E559 Vitamin D deficiency, unspecified: Secondary | ICD-10-CM

## 2018-01-20 DIAGNOSIS — Z0000 Encounter for general adult medical examination without abnormal findings: Secondary | ICD-10-CM

## 2018-01-20 DIAGNOSIS — Z1211 Encounter for screening for malignant neoplasm of colon: Secondary | ICD-10-CM | POA: Diagnosis not present

## 2018-01-20 LAB — POCT URINALYSIS DIPSTICK
Bilirubin, UA: NEGATIVE
Blood, UA: NEGATIVE
Glucose, UA: NEGATIVE
KETONES UA: NEGATIVE
LEUKOCYTES UA: NEGATIVE
NITRITE UA: NEGATIVE
PH UA: 6 (ref 5.0–8.0)
PROTEIN UA: NEGATIVE
Spec Grav, UA: <=1.005 — AB (ref 1.010–1.025)
UROBILINOGEN UA: 0.2 U/dL

## 2018-01-20 MED ORDER — TRIAMCINOLONE ACETONIDE 0.5 % EX CREA: 1.0000 "application " | TOPICAL_CREAM | Freq: Three times a day (TID) | CUTANEOUS | 0 refills | Status: AC

## 2018-01-20 NOTE — Progress Notes (Signed)
Subjective:     Patient ID: Stacey Williamson , female    DOB: Jan 30, 1966 , 52 y.o.   MRN: [id number]   Chief Complaint  Patient presents with  . Annual Exam    HPI The patient states she uses none for birth control. Last LMP was No LMP recorded. (Menstrual status: IUD).. Negative for Dysmenorrhea and Negative for Menorrhagia Mammogram last done 05/30/2016 Negative for: breast discharge, breast lump(s), breast pain and breast self exam.  Pertinent negatives include abnormal bleeding (hematology), anxiety, decreased libido, depression, difficulty falling sleep, dyspareunia, history of infertility, nocturia, sexual dysfunction, sleep disturbances, urinary incontinence, urinary urgency, vaginal discharge and vaginal itching. Diet regular.The patient states her exercise level is  rarely  . The patient's tobacco use is:  Social History   Tobacco Use  Smoking Status Never Smoker  Smokeless Tobacco Never Used  . She has been exposed to passive smoke. The patient's alcohol use is:  Social History   Substance and Sexual Activity  Alcohol Use Never  . Frequency: Never  . Additional information: Last pap 02/2017, next one scheduled for 02/2020.   HPI   Past Medical History:  Diagnosis Date  . Dysphagia   . PFO (patent foramen ovale)   . Scleroderma ([place])      No family history on file.  No current outpatient medications on file.   No Known Allergies   Review of Systems  Constitutional: Negative.   HENT: Negative.   Eyes: Negative.   Respiratory: Negative.   Cardiovascular: Negative.   Gastrointestinal: Negative.   Endocrine: Negative.   Genitourinary: Negative.   Musculoskeletal: Negative.   Skin: Negative.   Allergic/Immunologic: Negative.   Neurological: Negative.   Hematological: Negative.   Psychiatric/Behavioral: Negative.      Today's Vitals   01/20/18 1501  BP: 102/64  Pulse: 65  Temp: 97.8 F (36.6 C)  TempSrc: Oral  SpO2: 96%  Weight: 129 lb 3.2 oz (58.6 kg)   Height: 5' 0.5" (1.537 m)  PainSc: 0-No pain   Body mass index is 24.82 kg/m.   Objective:  Physical Exam  Constitutional: She is oriented to person, place, and time. She appears well-developed and well-nourished.  HENT:  Head: Normocephalic and atraumatic.  Right Ear: External ear normal.  Left Ear: External ear normal.  Nose: Nose normal.  Mouth/Throat: Oropharynx is clear and moist.  Eyes: Pupils are equal, round, and reactive to light. Conjunctivae and EOM are normal.  Neck: Normal range of motion. Neck supple.  Cardiovascular: Normal rate, regular rhythm, normal heart sounds and intact distal pulses.  Pulmonary/Chest: Effort normal and breath sounds normal. She exhibits no tenderness. No breast tenderness or discharge. Breasts are symmetrical.  She has bilateral breast implants done recently, the scar area below the right breast is slightly tender to touch  Abdominal: Soft. Bowel sounds are normal.  Musculoskeletal: Normal range of motion.  Neurological: She is alert and oriented to person, place, and time.  Skin: Skin is warm and dry. Capillary refill takes less than 2 seconds.  Psychiatric: She has a normal mood and affect. Cognition and memory are normal.        Assessment And Plan:     1. Routine general medical examination at a health care facility . Behavior modifications discussed and diet history reviewed.   . Pt will continue to exercise regularly and modify diet with low GI, plant based foods and decrease intake of processed foods.  . Recommend intake of daily multivitamin, Vitamin D, and  calcium.  . Recommend mammogram and colonoscopy for preventive screenings, as well as recommend immunizations that include influenza, TDAP   - Lipid Profile - CBC no Diff - BMP8+eGFR  2. Dermatitis  No actual rash noted at this time, intermittent rash with weather change  - triamcinolone cream (KENALOG) 0.5 %; Apply 1 application topically 3 (three) times daily.  Dispense:  30 g; Refill: 0  3. Vitamin D deficiency  Will check vitamin d level  Not taking any vitamin d at this time - Vitamin D 1,25 dihydroxy  4. Encounter for screening for malignant neoplasm of colon  Denies family history of colon cancer - Ambulatory referral to Gastroenterology       Minette Brine, FNP

## 2018-01-20 NOTE — Patient Instructions (Signed)

## 2018-01-25 LAB — VITAMIN D 1,25 DIHYDROXY
VITAMIN D3 1, 25 (OH): 45 pg/mL
Vitamin D 1, 25 (OH)2 Total: 49 pg/mL

## 2018-01-25 LAB — LIPID PANEL
CHOL/HDL RATIO: 4.4 ratio (ref 0.0–4.4)
Cholesterol, Total: 196 mg/dL (ref 100–199)
HDL: 45 mg/dL (ref >39)
LDL Calculated: 123 mg/dL — ABNORMAL HIGH (ref 0–99)
Triglycerides: 140 mg/dL (ref 0–149)
VLDL Cholesterol Cal: 28 mg/dL (ref 5–40)

## 2018-01-25 LAB — BMP8+EGFR
BUN/Creatinine Ratio: 20 (ref 9–23)
BUN: 11 mg/dL (ref 6–24)
CHLORIDE: 103 mmol/L (ref 96–106)
CO2: 25 mmol/L (ref 20–29)
Calcium: 9.3 mg/dL (ref 8.7–10.2)
Creatinine, Ser: 0.56 mg/dL — ABNORMAL LOW (ref 0.57–1.00)
GFR calc Af Amer: 126 mL/min/{1.73_m2} (ref >59)
GFR calc non Af Amer: 109 mL/min/{1.73_m2} (ref >59)
Glucose: 82 mg/dL (ref 65–99)
POTASSIUM: 4.1 mmol/L (ref 3.5–5.2)
Sodium: 142 mmol/L (ref 134–144)

## 2018-01-25 LAB — CBC
HEMATOCRIT: 37.4 % (ref 34.0–46.6)
Hemoglobin: 12.6 g/dL (ref 11.1–15.9)
MCH: 28.6 pg (ref 26.6–33.0)
MCHC: 33.7 g/dL (ref 31.5–35.7)
MCV: 85 fL (ref 79–97)
PLATELETS: 232 10*3/uL (ref 150–450)
RBC: 4.4 x10E6/uL (ref 3.77–5.28)
RDW: 12 % — ABNORMAL LOW (ref 12.3–15.4)
WBC: 6.6 10*3/uL (ref 3.4–10.8)

## 2018-07-19 ENCOUNTER — Ambulatory Visit: Payer: BLUE CROSS/BLUE SHIELD | Admitting: Nurse Practitioner

## 2018-07-19 ENCOUNTER — Other Ambulatory Visit: Payer: Self-pay | Admitting: Nurse Practitioner

## 2018-07-19 ENCOUNTER — Other Ambulatory Visit: Payer: Self-pay

## 2018-07-19 ENCOUNTER — Encounter: Payer: Self-pay | Admitting: Nurse Practitioner

## 2018-07-19 VITALS — BP 122/68 | HR 78 | Temp 97.4°F | Ht 60.05 in | Wt 135.8 lb

## 2018-07-19 DIAGNOSIS — E7800 Pure hypercholesterolemia, unspecified: Secondary | ICD-10-CM | POA: Diagnosis not present

## 2018-07-19 DIAGNOSIS — Z1231 Encounter for screening mammogram for malignant neoplasm of breast: Secondary | ICD-10-CM

## 2018-07-19 LAB — LIPID PANEL
Chol/HDL Ratio: 4 ratio (ref 0.0–4.4)
Cholesterol, Total: 175 mg/dL (ref 100–199)
HDL: 44 mg/dL (ref >39)
LDL Calculated: 108 mg/dL — ABNORMAL HIGH (ref 0–99)
Triglycerides: 117 mg/dL (ref 0–149)
VLDL Cholesterol Cal: 23 mg/dL (ref 5–40)

## 2018-07-19 NOTE — Progress Notes (Signed)
  Subjective:     Patient ID: Stacey Williamson , female    DOB: 02-13-1966 , 53 y.o.   MRN: [id number]   Chief Complaint  Patient presents with  . Follow-up    HPI  Hyperlipidemia - she is not on any medications for this.      Past Medical History:  Diagnosis Date  . Dysphagia   . PFO (patent foramen ovale)   . Scleroderma ([facility])      No family history on file.   Current Outpatient Medications:  .  cholecalciferol (VITAMIN D3) 25 MCG (1000 UT) tablet, Take 1,000 Units by mouth daily., Disp: , Rfl:  .  triamcinolone cream (KENALOG) 0.5 %, Apply 1 application topically 3 (three) times daily., Disp: 30 g, Rfl: 0   No Known Allergies   Review of Systems  Constitutional: Negative for fatigue and fever.  Respiratory: Negative.  Negative for cough.   Cardiovascular: Negative.  Negative for chest pain, palpitations and leg swelling.  Musculoskeletal: Negative.   Neurological: Negative for dizziness and headaches.     Today's Vitals   07/19/18 1122  BP: 122/68  Pulse: 78  Temp: (!) 97.4 F (36.3 C)  TempSrc: Oral  SpO2: 97%  Weight: 135 lb 12.8 oz (61.6 kg)  Height: 5' 0.05" (1.525 m)   Body mass index is 26.48 kg/m.   Objective:  Physical Exam Vitals signs reviewed.  Constitutional:      Appearance: Normal appearance.  Cardiovascular:     Rate and Rhythm: Normal rate and regular rhythm.     Pulses: Normal pulses.     Heart sounds: Normal heart sounds. No murmur.  Pulmonary:     Effort: Pulmonary effort is normal.     Breath sounds: Normal breath sounds.  Skin:    Capillary Refill: Capillary refill takes less than 2 seconds.  Neurological:     General: No focal deficit present.     Mental Status: She is alert and oriented to person, place, and time.  Psychiatric:        Mood and Affect: Mood normal.        Behavior: Behavior normal.        Thought Content: Thought content normal.        Judgment: Judgment normal.         Assessment And Plan:     1.  Elevated cholesterol  Slightly elevated at last visit will recheck levels.  No current medications - Lipid Profile  2. Encounter for screening mammogram for breast cancer  Pt instructed on Self Breast Exam.According to ACOG guidelines Women aged 58 and older are recommended to get an annual mammogram. Form completed and given to patient contact the [facility] for appointment scheduing.   Pt encouraged to get annual mammogram - MM Digital Screening; Future   Arnette Felts, FNP    THE PATIENT IS ENCOURAGED TO PRACTICE SOCIAL DISTANCING DUE TO THE COVID-19 PANDEMIC.

## 2018-07-21 ENCOUNTER — Ambulatory Visit: Payer: BLUE CROSS/BLUE SHIELD | Admitting: Nurse Practitioner

## 2019-01-24 ENCOUNTER — Other Ambulatory Visit: Payer: Self-pay

## 2019-01-24 ENCOUNTER — Encounter: Payer: BLUE CROSS/BLUE SHIELD | Admitting: Nurse Practitioner
